# Patient Record
Sex: Female | Born: 1945
Health system: Southern US, Community
[De-identification: ages and names within clinical notes are randomized; demographics above are authoritative.]

## PROBLEM LIST (undated history)

## (undated) DIAGNOSIS — C8590 Non-Hodgkin lymphoma, unspecified, unspecified site: Secondary | ICD-10-CM

## (undated) DIAGNOSIS — M8580 Other specified disorders of bone density and structure, unspecified site: Secondary | ICD-10-CM

## (undated) HISTORY — PX: BREAST BIOPSY: SHX20

## (undated) HISTORY — DX: Non-Hodgkin lymphoma, unspecified, unspecified site: C85.90

## (undated) HISTORY — DX: Other specified disorders of bone density and structure, unspecified site: M85.80

---

## 1999-10-25 ENCOUNTER — Encounter: Payer: Self-pay | Admitting: Orthopedic Surgery

## 1999-10-25 ENCOUNTER — Encounter: Admission: RE | Admit: 1999-10-25 | Discharge: 1999-10-25 | Payer: Self-pay | Admitting: Orthopedic Surgery

## 2017-09-18 ENCOUNTER — Ambulatory Visit: Payer: Self-pay | Admitting: Family Medicine

## 2017-09-23 ENCOUNTER — Ambulatory Visit: Payer: Self-pay | Admitting: Family Medicine

## 2017-09-26 DIAGNOSIS — H52209 Unspecified astigmatism, unspecified eye: Secondary | ICD-10-CM | POA: Diagnosis not present

## 2017-09-26 DIAGNOSIS — H5203 Hypermetropia, bilateral: Secondary | ICD-10-CM | POA: Diagnosis not present

## 2017-09-26 DIAGNOSIS — H524 Presbyopia: Secondary | ICD-10-CM | POA: Diagnosis not present

## 2017-10-09 ENCOUNTER — Ambulatory Visit (INDEPENDENT_AMBULATORY_CARE_PROVIDER_SITE_OTHER): Payer: Medicare HMO | Admitting: Family Medicine

## 2017-10-09 ENCOUNTER — Encounter: Payer: Self-pay | Admitting: Family Medicine

## 2017-10-09 ENCOUNTER — Telehealth: Payer: Self-pay

## 2017-10-09 VITALS — BP 138/90 | HR 103 | Temp 98.6°F | Ht 63.5 in | Wt 188.1 lb

## 2017-10-09 DIAGNOSIS — B0229 Other postherpetic nervous system involvement: Secondary | ICD-10-CM | POA: Insufficient documentation

## 2017-10-09 MED ORDER — AMITRIPTYLINE HCL 10 MG PO TABS: 10.0000 mg | ORAL_TABLET | Freq: Every day | ORAL | 2 refills | Status: DC

## 2017-10-09 MED FILL — AMITRIPTYLINE HCL 10 MG TAB: 10 | 30 days supply | Qty: 30 | Fill #0

## 2017-10-09 NOTE — Patient Instructions (Addendum)
Come to your next appointment fasting.  Let us know if you need anything.  

## 2017-10-09 NOTE — Progress Notes (Signed)
Pre visit review using our clinic review tool, if applicable. No additional management support is needed unless otherwise documented below in the visit note. 

## 2017-10-09 NOTE — Progress Notes (Signed)
Chief Complaint  Patient presents with  . New Patient (Initial Visit)       New Patient Visit SUBJECTIVE: HPI: Suzanne Simon is an 72 y.o.female who is being seen for establishing care.  The patient was previously seen in [facility].   Hx of Shingles on L side. Tx'd with lidocaine patches. This was in 2004. Feb of this year, started having itching/bruning over the exact same region. She notices that when things dry out it gets worse. Not taking any meds. She has not noticed any skin changes over the area.  Had been prescribed Lyrica in the past for this issue, however family member told her she should not take this medicine.  No Known Allergies  Past Medical History:  Diagnosis Date  . Lymphoma ([place])    History reviewed. No pertinent surgical history. Family History  Problem Relation Age of Onset  . Cancer Mother        cervical   No Known Allergies  Takes no medications routinely.  ROS Const: Denies fevers  Skin: As noted in HPI   OBJECTIVE: BP 138/90 (BP Location: Left Arm, Patient Position: Sitting, Cuff Size: Normal)   Pulse (!) 103   Temp 98.6 F (37 C) (Oral)   Ht 5' 3.5" (1.613 m)   Wt 188 lb 2 oz (85.3 kg)   SpO2 98%   BMI 32.80 kg/m   Constitutional: -  VS reviewed -  Well developed, well nourished, appears stated age -  No apparent distress  Psychiatric: -  Oriented to person, place, and time -  Memory intact -  Affect and mood normal -  Fluent conversation, good eye contact -  Judgment and insight age appropriate  Eye: -  Conjunctivae clear, no discharge -  Pupils symmetric, round, reactive to light  ENMT: -  MMM    Pharynx moist, no exudate, no erythema  Neck: -  No gross swelling, no palpable masses -  Thyroid midline, not enlarged, mobile, no palpable masses  Cardiovascular: -  RRR -  No LE edema  Respiratory: -  Normal respiratory effort, no accessory muscle use, no retraction -  Breath sounds equal, no wheezes, no ronchi, no crackles  Skin: -Some  hyperpigmentation in a dermatomal distribution on the left side corresponding with her symptoms.  There is no tenderness to palpation, excessive warmth, fluctuance, erythema, or drainage.  She was examined in the presence of a female chaperone. -  Warm and dry to palpation   ASSESSMENT/PLAN: Post herpetic neuralgia - Plan: amitriptyline (ELAVIL) 10 MG tablet  Patient instructed to sign release of records form from her previous PCP. Start Elavil, very low dose. Patient should return in 6 weeks for a physical, we will recheck her blood pressure and see how she is doing on the new medication. The patient voiced understanding and agreement to the plan.   [place], DO 10/09/17  5:10 PM

## 2017-10-09 NOTE — Telephone Encounter (Signed)
PA initiated via Covermymeds; KEY: [id number]. Awaiting determination.

## 2017-10-10 ENCOUNTER — Encounter: Payer: Self-pay | Admitting: Family Medicine

## 2017-10-11 NOTE — Telephone Encounter (Signed)
PA approved.  PA Case: [id number], Status: Approved, Coverage Starts on: 10/10/2017 12:00:00 AM, Coverage Ends on: 10/10/2019 12:00:00 AM. Questions? Contact [phone]

## 2017-11-04 MED FILL — AMITRIPTYLINE HCL 10 MG TAB: 10 | 30 days supply | Qty: 30 | Fill #1

## 2017-11-20 ENCOUNTER — Encounter: Payer: Medicare HMO | Admitting: Family Medicine

## 2017-11-27 ENCOUNTER — Telehealth: Payer: Self-pay

## 2017-11-27 NOTE — Telephone Encounter (Signed)
Called left message to call back 

## 2017-11-27 NOTE — Telephone Encounter (Signed)
Copied from [place] [phone]. Topic: General - Other >> Nov 27, 2017 11:34 AM Suzanne Simon wrote: Reason for CRM:  patient has an appt for a physical  on 01-26-18,  and she is wanting to know  if the medication amitriptyline (ELAVIL) 10 MG tablet is okay to take before her appt. Please advise

## 2017-11-27 NOTE — Telephone Encounter (Signed)
Yes, that is fine.   TY

## 2017-11-28 ENCOUNTER — Encounter: Payer: Self-pay | Admitting: Family Medicine

## 2017-11-28 NOTE — Telephone Encounter (Signed)
The patient was informed.

## 2017-12-26 ENCOUNTER — Ambulatory Visit (INDEPENDENT_AMBULATORY_CARE_PROVIDER_SITE_OTHER): Payer: Medicare HMO | Admitting: Family Medicine

## 2017-12-26 ENCOUNTER — Encounter: Payer: Self-pay | Admitting: Family Medicine

## 2017-12-26 VITALS — BP 132/86 | HR 109 | Temp 98.0°F | Ht 63.5 in | Wt 179.4 lb

## 2017-12-26 DIAGNOSIS — R109 Unspecified abdominal pain: Secondary | ICD-10-CM

## 2017-12-26 DIAGNOSIS — Z0000 Encounter for general adult medical examination without abnormal findings: Secondary | ICD-10-CM

## 2017-12-26 DIAGNOSIS — Z13820 Encounter for screening for osteoporosis: Secondary | ICD-10-CM

## 2017-12-26 DIAGNOSIS — B0229 Other postherpetic nervous system involvement: Secondary | ICD-10-CM | POA: Diagnosis not present

## 2017-12-26 DIAGNOSIS — Z1231 Encounter for screening mammogram for malignant neoplasm of breast: Secondary | ICD-10-CM

## 2017-12-26 DIAGNOSIS — Z23 Encounter for immunization: Secondary | ICD-10-CM | POA: Diagnosis not present

## 2017-12-26 DIAGNOSIS — E2839 Other primary ovarian failure: Secondary | ICD-10-CM

## 2017-12-26 DIAGNOSIS — E785 Hyperlipidemia, unspecified: Secondary | ICD-10-CM | POA: Diagnosis not present

## 2017-12-26 DIAGNOSIS — Z1159 Encounter for screening for other viral diseases: Secondary | ICD-10-CM | POA: Diagnosis not present

## 2017-12-26 DIAGNOSIS — Z1239 Encounter for other screening for malignant neoplasm of breast: Secondary | ICD-10-CM

## 2017-12-26 LAB — COMPREHENSIVE METABOLIC PANEL
ALT: 7 U/L (ref 0–35)
AST: 16 U/L (ref 0–37)
Albumin: 4.4 g/dL (ref 3.5–5.2)
Alkaline Phosphatase: 68 U/L (ref 39–117)
BUN: 12 mg/dL (ref 6–23)
CO2: 28 mEq/L (ref 19–32)
Calcium: 10.2 mg/dL (ref 8.4–10.5)
Chloride: 107 mEq/L (ref 96–112)
Creatinine, Ser: 0.97 mg/dL (ref 0.40–1.20)
GFR: 72.35 mL/min (ref >60.00)
Glucose, Bld: 90 mg/dL (ref 70–99)
Potassium: 4.1 mEq/L (ref 3.5–5.1)
Sodium: 142 mEq/L (ref 135–145)
Total Bilirubin: 0.9 mg/dL (ref 0.2–1.2)
Total Protein: 7.5 g/dL (ref 6.0–8.3)

## 2017-12-26 LAB — LIPID PANEL
Cholesterol: 218 mg/dL — ABNORMAL HIGH (ref 0–200)
HDL: 59.8 mg/dL (ref >39.00)
LDL Cholesterol: 144 mg/dL — ABNORMAL HIGH (ref 0–99)
NonHDL: 157.74
Total CHOL/HDL Ratio: 4
Triglycerides: 70 mg/dL (ref 0.0–149.0)
VLDL: 14 mg/dL (ref 0.0–40.0)

## 2017-12-26 MED ORDER — GABAPENTIN 300 MG PO CAPS: ORAL_CAPSULE | ORAL | 1 refills | Status: AC

## 2017-12-26 MED FILL — GABAPENTIN 300 MG CAPSULE: 300 | 31 days supply | Qty: 90 | Fill #0

## 2017-12-26 NOTE — Progress Notes (Signed)
Pre visit review using our clinic review tool, if applicable. No additional management support is needed unless otherwise documented below in the visit note. 

## 2017-12-26 NOTE — Patient Instructions (Addendum)
Give [place] 2-3 business days to get the results of your labs back.  Great work with your weight loss. Keep the diet clean and stay active.  If you do not hear anything about your imaging in the next 1-2 weeks, call our office and ask for an update.  Get me your records please.  Let us know if you need anything.

## 2017-12-26 NOTE — Progress Notes (Signed)
Chief Complaint  Patient presents with  . Annual Exam     Well Woman Suzanne Simon is here for a complete physical.   Her last physical was >1 year ago.  Current diet: in general, a "healthy" diet. Has lost some weight Current exercise: walking more. Weight is going down in and shedenies daytime fatigue. No LMP recorded.  Seatbelt? Yes  30-40 d of L sided side pain; constant, worse when she inhales. Follows distribution of rib. No inj or change in activity. She will be intermittently constipated, but this pain is not affected by BM's or gas.   Hx of PHN, was started on Elavil 10 mg/d and did not notice any effect. Not currently taking. stlil having burning. Has been using lidocaine patches with intermittent relief. Again, was started on Lyrica but did not take in past due to acquaintance telling her not to.   Health Maintenance Colonoscopy- Yes - will get [place] dates DEXA- No  Mammogram- No Tetanus- No Pneumonia- No Hep C screen- No  Past Medical History:  Diagnosis Date  . Lymphoma ([place])      History reviewed. No pertinent surgical history.  Medications  Takes no meds routinely.   Allergies No Known Allergies  Review of Systems: Constitutional:  no sweats Eye:  no recent significant change in vision Ear/Nose/Mouth/Throat:  Ears:  No changes in hearing Nose/Mouth/Throat:  no complaints of nasal congestion, no sore throat Cardiovascular: no chest pain Respiratory:  No cough and no shortness of breath Gastrointestinal:  no abdominal pain, no change in bowel habits when she takes OTC supp for constipation GU:  Female: negative for dysuria or pelvic pain Musculoskeletal/Extremities: +chest wall pain; otherwise no pain of the joints Integumentary (Skin/Breast): +burning over skin as noted above; otherwise no abnormal skin lesions reported Neurologic:  no headaches Psychiatric:  no anxiety, no depression Endocrine:  denies unexplained weight changes Hematologic/Lymphatic:   no abnormal bleeding  Exam BP 132/86 (BP Location: Left Arm, Patient Position: Sitting, Cuff Size: Normal)   Pulse (!) 109   Temp 98 F (36.7 C) (Oral)   Ht 5' 3.5" (1.613 m)   Wt 179 lb 6 oz (81.4 kg)   SpO2 97%   BMI 31.28 kg/m  General:  well developed, well nourished, in no apparent distress Skin:  no significant moles, warts, or growths Head:  no masses, lesions, or tenderness Eyes:  pupils equal and round, sclera anicteric without injection Ears:  canals without lesions, TMs shiny without retraction, no obvious effusion, no erythema Nose:  nares patent, septum midline, mucosa normal, and no drainage or sinus tenderness Throat/Pharynx:  lips and gingiva without lesion; tongue and uvula midline; non-inflamed pharynx; no exudates or postnasal drainage Neck: neck supple without adenopathy, thyromegaly, or masses Lungs:  clear to auscultation, breath sounds equal bilaterally, no respiratory distress Cardio:  regular rate and rhythm, no bruits or LE edema Abdomen:  abdomen soft, nontender; bowel sounds normal; no masses or organomegaly Genital: Deferred Musculoskeletal: +TTP over L rib cage; symmetrical muscle groups noted without atrophy or deformity Extremities:  no clubbing, cyanosis, or edema, no deformities, no skin discoloration Neuro:  gait normal; deep tendon reflexes normal and symmetric Psych: well oriented with normal range of affect and appropriate judgment/insight  Assessment and Plan  Well adult exam - Plan: Comprehensive metabolic panel, Lipid panel  Encounter for hepatitis C screening test for low risk patient - Plan: Hepatitis C antibody  Side pain  Post herpetic neuralgia - Plan: gabapentin (NEURONTIN) 300 MG  capsule  Estrogen deficiency - Plan: MM 3D SCREEN BREAST BILATERAL, DG Bone Density  Screening for malignant neoplasm of breast - Plan: MM 3D SCREEN BREAST BILATERAL  Screening for breast cancer - Plan: MM 3D SCREEN BREAST BILATERAL  Screening for  osteoporosis - Plan: DG Bone Density  Need for tetanus booster - Plan: Tdap vaccine greater than or equal to 7yo IM  Need for vaccination against Streptococcus pneumoniae - Plan: Pneumococcal conjugate vaccine 13-valent   Well 72 y.o. female. Counseled on diet and exercise. She is doing well, has lost 9 lbs. For side pain, ice/heat, Tylenol, stretch sides daily. Stop Elavil, start gabapentin. She will get [place] records for her colonoscopy.  Other orders as above. Follow up in 1 mo to recheck PHN pending the above workup. The patient voiced understanding and agreement to the plan.  [place], DO 12/26/17 3:08 PM

## 2017-12-27 LAB — HEPATITIS C ANTIBODY
Hepatitis C Ab: NONREACTIVE
SIGNAL TO CUT-OFF: 0.01 (ref <1.00)

## 2018-01-09 ENCOUNTER — Ambulatory Visit (HOSPITAL_BASED_OUTPATIENT_CLINIC_OR_DEPARTMENT_OTHER): Payer: Medicare HMO

## 2018-01-09 ENCOUNTER — Other Ambulatory Visit (HOSPITAL_BASED_OUTPATIENT_CLINIC_OR_DEPARTMENT_OTHER): Payer: Medicare HMO

## 2018-01-10 ENCOUNTER — Other Ambulatory Visit (HOSPITAL_BASED_OUTPATIENT_CLINIC_OR_DEPARTMENT_OTHER): Payer: Medicare HMO

## 2018-01-10 ENCOUNTER — Ambulatory Visit (HOSPITAL_BASED_OUTPATIENT_CLINIC_OR_DEPARTMENT_OTHER): Payer: Medicare HMO

## 2018-01-16 ENCOUNTER — Ambulatory Visit (HOSPITAL_BASED_OUTPATIENT_CLINIC_OR_DEPARTMENT_OTHER)
Admission: RE | Admit: 2018-01-16 | Discharge: 2018-01-16 | Disposition: A | Discharge status: Home / Self Care | Payer: Medicare HMO | Source: Ambulatory Visit | Attending: Family Medicine | Admitting: Family Medicine

## 2018-01-16 ENCOUNTER — Encounter (HOSPITAL_BASED_OUTPATIENT_CLINIC_OR_DEPARTMENT_OTHER): Payer: Self-pay

## 2018-01-16 DIAGNOSIS — E2839 Other primary ovarian failure: Secondary | ICD-10-CM | POA: Insufficient documentation

## 2018-01-16 DIAGNOSIS — Z13820 Encounter for screening for osteoporosis: Secondary | ICD-10-CM | POA: Insufficient documentation

## 2018-01-16 DIAGNOSIS — Z1231 Encounter for screening mammogram for malignant neoplasm of breast: Secondary | ICD-10-CM | POA: Insufficient documentation

## 2018-01-16 DIAGNOSIS — Z780 Asymptomatic menopausal state: Secondary | ICD-10-CM | POA: Diagnosis not present

## 2018-01-16 DIAGNOSIS — M85852 Other specified disorders of bone density and structure, left thigh: Secondary | ICD-10-CM | POA: Diagnosis not present

## 2018-01-16 DIAGNOSIS — Z1239 Encounter for other screening for malignant neoplasm of breast: Secondary | ICD-10-CM

## 2018-01-17 ENCOUNTER — Encounter: Payer: Self-pay | Admitting: Family Medicine

## 2018-01-17 DIAGNOSIS — M8580 Other specified disorders of bone density and structure, unspecified site: Secondary | ICD-10-CM | POA: Insufficient documentation

## 2018-02-12 ENCOUNTER — Ambulatory Visit: Payer: Medicare HMO | Admitting: Family Medicine

## 2018-02-12 DIAGNOSIS — Z0289 Encounter for other administrative examinations: Secondary | ICD-10-CM

## 2018-02-13 ENCOUNTER — Encounter: Payer: Self-pay | Admitting: Family Medicine

## 2018-02-20 ENCOUNTER — Encounter: Payer: Self-pay | Admitting: Family Medicine

## 2018-09-23 DIAGNOSIS — S0101XA Laceration without foreign body of scalp, initial encounter: Secondary | ICD-10-CM | POA: Diagnosis not present

## 2018-09-23 DIAGNOSIS — Z01810 Encounter for preprocedural cardiovascular examination: Secondary | ICD-10-CM | POA: Diagnosis not present

## 2018-09-23 DIAGNOSIS — R410 Disorientation, unspecified: Secondary | ICD-10-CM | POA: Diagnosis not present

## 2018-09-23 DIAGNOSIS — R0602 Shortness of breath: Secondary | ICD-10-CM | POA: Diagnosis not present

## 2018-09-23 DIAGNOSIS — R609 Edema, unspecified: Secondary | ICD-10-CM | POA: Diagnosis not present

## 2018-09-23 DIAGNOSIS — S0990XA Unspecified injury of head, initial encounter: Secondary | ICD-10-CM | POA: Diagnosis not present

## 2018-09-23 DIAGNOSIS — W010XXA Fall on same level from slipping, tripping and stumbling without subsequent striking against object, initial encounter: Secondary | ICD-10-CM | POA: Diagnosis not present

## 2018-10-07 DIAGNOSIS — S0181XD Laceration without foreign body of other part of head, subsequent encounter: Secondary | ICD-10-CM | POA: Diagnosis not present

## 2018-10-07 DIAGNOSIS — Z4802 Encounter for removal of sutures: Secondary | ICD-10-CM | POA: Diagnosis not present
